# Patient Record
Sex: Female | Born: 1985 | Race: White | Hispanic: No | Marital: Single | Smoking: Never smoker
Health system: Southern US, Community
[De-identification: ages and names within clinical notes are randomized; demographics above are authoritative.]

## PROBLEM LIST (undated history)

## (undated) DIAGNOSIS — E119 Type 2 diabetes mellitus without complications: Secondary | ICD-10-CM

## (undated) HISTORY — PX: WRIST SURGERY: SHX841

---

## 2016-04-19 ENCOUNTER — Encounter: Payer: Self-pay | Admitting: Emergency Medicine

## 2016-04-19 ENCOUNTER — Emergency Department (INDEPENDENT_AMBULATORY_CARE_PROVIDER_SITE_OTHER)
Admission: EM | Admit: 2016-04-19 | Discharge: 2016-04-19 | Disposition: A | Discharge status: Home / Self Care | Payer: Self-pay | Source: Home / Self Care | Attending: Family Medicine | Admitting: Family Medicine

## 2016-04-19 DIAGNOSIS — B9789 Other viral agents as the cause of diseases classified elsewhere: Secondary | ICD-10-CM

## 2016-04-19 DIAGNOSIS — J069 Acute upper respiratory infection, unspecified: Secondary | ICD-10-CM

## 2016-04-19 MED ORDER — AZITHROMYCIN 250 MG PO TABS: ORAL_TABLET | ORAL | 0 refills | Status: DC

## 2016-04-19 MED ORDER — BENZONATATE 200 MG PO CAPS: ORAL_CAPSULE | ORAL | 0 refills | Status: DC

## 2016-04-19 NOTE — ED Provider Notes (Signed)
[facility]    CSN: [id number] Arrival date & time: 04/19/16  1323     History   Chief Complaint Chief Complaint  Patient presents with  . URI    HPI Lacey Todd is a 31 y.o. female.   About one week ago patient developed typical cold-like symptoms developing over several days, including mild sore throat, sinus congestion, headache, and fatigue.  She was initially treated with prednisone, and improved somewhat, but has now developed a cough and increased sinus congestion.  She has felt hot, but denies fever.  Yesterday her ears felt clogged.  She has a past history of otitis media when she was younger.  Four days ago she had nausea/vomiting and diarrhea that lasted only one day.  She often coughs until she gags.   The history is provided by the patient.    History reviewed. No pertinent past medical history.  There are no active problems to display for this patient.   History reviewed. No pertinent surgical history.  OB History    No data available       Home Medications    Prior to Admission medications   Medication Sig Start Date End Date Taking? Authorizing Provider  guaiFENesin (MUCINEX) 600 MG 12 hr tablet Take by mouth 2 (two) times daily.   Yes Historical Provider, MD  azithromycin (ZITHROMAX Z-PAK) 250 MG tablet Take 2 tabs today; then begin one tab once daily for 4 more days. 04/19/16   Lattie Haw, MD  benzonatate (TESSALON) 200 MG capsule Take one cap by mouth at bedtime as needed for cough.  May repeat in 4 to 6 hours 04/19/16   Lattie Haw, MD    Family History No family history on file.  Social History Social History  Substance Use Topics  . Smoking status: Never Smoker  . Smokeless tobacco: Never Used  . Alcohol use No     Allergies   Amoxicillin; Penicillins; and Septra [sulfamethoxazole-trimethoprim]   Review of Systems Review of Systems + sore throat + cough No pleuritic pain No wheezing + nasal congestion +  post-nasal drainage No sinus pain/pressure No itchy/red eyes ? earache No hemoptysis No SOB No fever/chills + nausea, resolved + vomiting, resolved No abdominal pain +diarrhea, resolved  No urinary symptoms No skin rash + fatigue No myalgias + headache Used OTC meds without relief   Physical Exam Triage Vital Signs ED Triage Vitals  Enc Vitals Group     BP 04/19/16 1414 125/83     Pulse Rate 04/19/16 1414 75     Resp --      Temp 04/19/16 1414 98.3 F (36.8 C)     Temp Source 04/19/16 1414 Oral     SpO2 04/19/16 1414 98 %     Weight 04/19/16 1415 273 lb (123.8 kg)     Height 04/19/16 1415 5\' 10"  (1.778 m)     Head Circumference --      Peak Flow --      Pain Score 04/19/16 1418 3     Pain Loc --      Pain Edu? --      Excl. in GC? --    No data found.   Updated Vital Signs BP 125/83 (BP Location: Left Arm)   Pulse 75   Temp 98.3 F (36.8 C) (Oral)   Ht 5\' 10"  (1.778 m)   Wt 273 lb (123.8 kg)   LMP 03/29/2016 (Exact Date)   SpO2 98%  BMI 39.17 kg/m   Visual Acuity Right Eye Distance:   Left Eye Distance:   Bilateral Distance:    Right Eye Near:   Left Eye Near:    Bilateral Near:     Physical Exam Nursing notes and Vital Signs reviewed. Appearance:  Patient appears stated age, and in no acute distress Eyes:  Pupils are equal, round, and reactive to light and accomodation.  Extraocular movement is intact.  Conjunctivae are not inflamed  Ears:  Canals normal.  Tympanic membranes normal.  Nose:  Mildly congested turbinates.  No sinus tenderness.   Pharynx:  Normal Neck:  Supple.  Tender enlarged posterior/lateral nodes are palpated bilaterally  Lungs:  Clear to auscultation.  Breath sounds are equal.  Moving air well. Heart:  Regular rate and rhythm without murmurs, rubs, or gallops.  Abdomen:  Nontender without masses or hepatosplenomegaly.  Bowel sounds are present.  No CVA or flank tenderness.  Extremities:  No edema.  Skin:  No rash present.     UC Treatments / Results  Labs (all labs ordered are listed, but only abnormal results are displayed) Labs Reviewed - No data to display  EKG  EKG Interpretation None       Radiology No results found.  Procedures Procedures (including critical care time)  Medications Ordered in UC Medications - No data to display   Initial Impression / Assessment and Plan / UC Course  I have reviewed the triage vital signs and the nursing notes.  Pertinent labs & imaging results that were available during my care of the patient were reviewed by me and considered in my medical decision making (see chart for details).    Begin Z-pak for atypical coverage.  Prescription written for Benzonatate [facility](Tessalon) to take at bedtime for night-time cough.                                                    Take plain guaifenesin (1200mg  extended release tabs such as Mucinex) twice daily, with plenty of water, for cough and congestion.  May add Pseudoephedrine (30mg , one or two every 4 to 6 hours) for sinus congestion.  Get adequate rest.   May use Afrin nasal spray (or generic oxymetazoline) twice daily for about 5 days and then discontinue.  Also recommend using saline nasal spray several times daily and saline nasal irrigation (AYR is a common brand).  Use Flonase nasal spray each morning after using Afrin nasal spray and saline nasal irrigation. Try warm salt water gargles for sore throat.  Stop all antihistamines for now, and other non-prescription cough/cold preparations. May take Ibuprofen 200mg , 4 tabs every 8 hours with food for headache, sore throat, etc. Followup with Family Doctor if not improved in one week.     Final Clinical Impressions(s) / UC Diagnoses   Final diagnoses:  Viral URI with cough    New Prescriptions New Prescriptions   AZITHROMYCIN (ZITHROMAX Z-PAK) 250 MG TABLET    Take 2 tabs today; then begin one tab once daily for 4 more days.   BENZONATATE (TESSALON) 200 MG CAPSULE     Take one cap by mouth at bedtime as needed for cough.  May repeat in 4 to 6 hours     Lattie HawStephen A Aeva Posey, MD 04/19/16 1524

## 2016-04-19 NOTE — ED Triage Notes (Signed)
Sinus pain, pressure, congestion, ears hurt, sore throat, cough, fatigue, headache x 1 week. Was treated last week with Prednisone Tues-Sat, didn't get better

## 2016-04-19 NOTE — Discharge Instructions (Signed)
Take plain guaifenesin (1200mg  extended release tabs such as Mucinex) twice daily, with plenty of water, for cough and congestion.  May add Pseudoephedrine (30mg , one or two every 4 to 6 hours) for sinus congestion.  Get adequate rest.   May use Afrin nasal spray (or generic oxymetazoline) twice daily for about 5 days and then discontinue.  Also recommend using saline nasal spray several times daily and saline nasal irrigation (AYR is a common brand).  Use Flonase nasal spray each morning after using Afrin nasal spray and saline nasal irrigation. Try warm salt water gargles for sore throat.  Stop all antihistamines for now, and other non-prescription cough/cold preparations. May take Ibuprofen 200mg , 4 tabs every 8 hours with food for headache, sore throat, etc.

## 2016-05-18 ENCOUNTER — Emergency Department
Admission: EM | Admit: 2016-05-18 | Discharge: 2016-05-18 | Disposition: A | Discharge status: Home / Self Care | Payer: Self-pay | Source: Home / Self Care | Attending: Family Medicine | Admitting: Family Medicine

## 2016-05-18 ENCOUNTER — Emergency Department (INDEPENDENT_AMBULATORY_CARE_PROVIDER_SITE_OTHER): Payer: Self-pay

## 2016-05-18 ENCOUNTER — Encounter: Payer: Self-pay | Admitting: *Deleted

## 2016-05-18 DIAGNOSIS — M25532 Pain in left wrist: Secondary | ICD-10-CM

## 2016-05-18 DIAGNOSIS — M79642 Pain in left hand: Secondary | ICD-10-CM

## 2016-05-18 DIAGNOSIS — S63502A Unspecified sprain of left wrist, initial encounter: Secondary | ICD-10-CM

## 2016-05-18 MED ORDER — ACETAMINOPHEN 325 MG PO TABS
650.0000 mg | ORAL_TABLET | Freq: Once | ORAL | Status: AC
  Administered 2016-05-18: 650 mg via ORAL

## 2016-05-18 NOTE — ED Triage Notes (Signed)
Pt reports a possible LT wrist sprain in November while kick boxing and re injured again last week while moving boxes. Today she reports sudden pain and heard a pop while in the shower. IBF at 1030 today.

## 2016-05-18 NOTE — ED Provider Notes (Signed)
CSN: [id number]     Arrival date & time 05/18/16  1609 History   First MD Initiated Contact with Patient 05/18/16 1630     Chief Complaint  Patient presents with  . Wrist Pain   (Consider location/radiation/quality/duration/timing/severity/associated sxs/prior Treatment) HPI Lacey Todd is a 31 y.o. female presenting to UC with c/o Left wrist pain that has been intermittent since injuring it in Nov 2017 while kick boxing. Pt notes she initially injured it by punching a new piece of equipment that did not have give like the prior bags she had practiced on.  Pain was aching and sore so she treated herself with an OTC wrist splint for self-dx sprain.  Pain gradually improved. This weekend she was lifting boxes for work and thinks she re-injured it as it was a little more sore.  This morning in the shower she turned her wrist and felt sudden onset sharp shooting pain and heard a "pop"  She took ibuprofen at 10:30AM today with mild relief. She is Right hand dominant.    History reviewed. No pertinent past medical history. History reviewed. No pertinent surgical history. History reviewed. No pertinent family history. Social History  Substance Use Topics  . Smoking status: Never Smoker  . Smokeless tobacco: Never Used  . Alcohol use No   OB History    No data available     Review of Systems  Musculoskeletal: Positive for arthralgias and myalgias. Negative for joint swelling.  Skin: Negative for color change and wound.  Neurological: Positive for weakness. Negative for numbness.    Allergies  Amoxicillin; Penicillins; and Septra [sulfamethoxazole-trimethoprim]  Home Medications   Prior to Admission medications   Not on File   Meds Ordered and Administered this Visit   Medications  acetaminophen (TYLENOL) tablet 650 mg (650 mg Oral Given 05/18/16 1635)    BP 119/84 (BP Location: Left Arm)   Pulse 73   Resp 16   LMP 04/26/2016   SpO2 99%  No data found.   Physical Exam   Constitutional: She is oriented to person, place, and time. She appears well-developed and well-nourished. No distress.  HENT:  Head: Normocephalic and atraumatic.  Eyes: EOM are normal.  Neck: Normal range of motion.  Cardiovascular: Normal rate.   Pulses:      Radial pulses are 2+ on the left side.  Pulmonary/Chest: Effort normal.  Musculoskeletal: She exhibits tenderness. She exhibits no edema.  Left wrist: no obvious edema. Tenderness to ulnar aspect. Slight decreased ROM with flexion and extension. 4/5 grip strength compared to the Right. Left elbow: full ROM, no tenderness.  Neurological: She is alert and oriented to person, place, and time.  Skin: Skin is warm and dry. Capillary refill takes less than 2 seconds. She is not diaphoretic. No erythema.  Left hand and wrist: skin in tact. No ecchymosis or erythema.   Psychiatric: She has a normal mood and affect. Her behavior is normal.  Nursing note and vitals reviewed.   Urgent Care Course     Procedures (including critical care time)  Labs Review Labs Reviewed - No data to display  Imaging Review Dg Wrist Complete Left  Result Date: 05/18/2016 CLINICAL DATA:  31 y/o  F; pain and swelling from injury 11/17. EXAM: LEFT HAND - COMPLETE 3+ VIEW; LEFT WRIST - COMPLETE 3+ VIEW COMPARISON:  None. FINDINGS: Left hand: There is no evidence of fracture or dislocation. There is no evidence of arthropathy or other focal bone abnormality. Soft tissues are unremarkable.  Left wrist: There is no evidence of fracture or dislocation. There is no evidence of arthropathy or other focal bone abnormality. Soft tissues are unremarkable. IMPRESSION: Negative. Electronically Signed   By: Mitzi Hansen M.D.   On: 05/18/2016 17:23   Dg Hand Complete Left  Result Date: 05/18/2016 CLINICAL DATA:  31 y/o  F; pain and swelling from injury 11/17. EXAM: LEFT HAND - COMPLETE 3+ VIEW; LEFT WRIST - COMPLETE 3+ VIEW COMPARISON:  None. FINDINGS: Left  hand: There is no evidence of fracture or dislocation. There is no evidence of arthropathy or other focal bone abnormality. Soft tissues are unremarkable. Left wrist: There is no evidence of fracture or dislocation. There is no evidence of arthropathy or other focal bone abnormality. Soft tissues are unremarkable. IMPRESSION: Negative. Electronically Signed   By: Mitzi Hansen M.D.   On: 05/18/2016 17:23     MDM   1. Sprain of left wrist, initial encounter    Skin in tact. No evidence of underlying infection.  No fracture or dislocation noted on imaging  Will continue to treat as sprain Home exercises provided Encouraged f/u with Sports Medicine in 1-2 weeks if not improving.     Junius Finner, PA-C 05/18/16 [phone]

## 2016-05-19 ENCOUNTER — Ambulatory Visit (INDEPENDENT_AMBULATORY_CARE_PROVIDER_SITE_OTHER): Payer: Self-pay | Admitting: Family Medicine

## 2016-05-19 DIAGNOSIS — M25532 Pain in left wrist: Secondary | ICD-10-CM

## 2016-05-19 MED ORDER — DICLOFENAC SODIUM 1 % TD GEL: 2.0000 g | Freq: Four times a day (QID) | TRANSDERMAL | 11 refills | Status: DC

## 2016-05-19 NOTE — Patient Instructions (Addendum)
Thank you for coming in today. You should hear about the MRI soon.  Schedule with me 1 hour before the MRI.  Follow up 2 days later or so.  Use voltaren gel for pain as well.   Use the brace as needed.    Triangular Fibrocartilage Tear A triangular fibrocartilage tear is a tear in cartilage or a ligament along the pinkie side of your wrist. The cartilage and ligaments in your wrist help to cushion and support to the bones of your wrist. What are the causes? This condition may be caused by:  Falling onto an outstretched hand and overextending your wrist.  Repetitive motions (overuse) that put too much pressure on your wrist. What increases the risk? This condition is more likely to develop in people who:  Have one forearm bone that is shorter than the other.  Participate in sports that put pressure on the wrist, such as:  Gymnastics.  Tennis.  Golf.  Baseball.  Racquetball.  Hockey. What are the signs or symptoms? Symptoms of this condition include:  Pain or tenderness on the pinkie side of your wrist.  A clicking or popping sensation in the wrist.  Reduced grip strength. How is this diagnosed? This condition may be diagnosed based on:  Your symptoms.  Your medical history.  A physical exam. During the exam your health care provider may move your hand and wrist to determine what is causing your pain.  Tests, such as:  An X-ray. This may be done to check for broken bones.  An MRI. This may be done to check ligaments and cartilage and to look for broken bones that did not show up on your X-ray.  An arthrogram. This is a kind of X-ray called that is taken after a dye is injected into your joint.  Diagnostic arthroscopy. This is a surgical procedure that lets your health care provider see inside your wrist joint. It may be done if the cause of your wrist pain is not clear after you have other tests. How is this treated? Treatment for this condition may  include:  Resting the wrist. You may need to avoid or modify your participation in sports or other physical activity for a period of time.  Icing the wrist. This can help with swelling and pain.  Keeping the wrist raised (elevated) above your heart. This helps reduce swelling.  Using a splint or cast. This helps keep the wrist still so it can heal.  Physical therapy. This helps restore range of motion in the wrist and strengthen the wrist.  Anti-inflammatory medicine, such as ibuprofen. These can help reduce pain and swelling.  Surgery. More serious tears or complete tears may require surgery to repair a ligament. Follow these instructions at home: If you have a splint:    Do not put pressure on any part of your splint until it is fully hardened. This may take several hours.  Wear it as told by your health care provider. Remove it only as told by your health care provider.  Loosen the splint if your fingers become numb and tingle, or if they turn cold and blue.  If your splint is not waterproof:  Do not let it get wet.  Cover it with a watertight covering when you take a bath or a shower.  Keep the splint clean. If you have a cast:   Do not put pressure on any part of your cast until it is fully hardened. This may take several hours.  Do  not stick anything inside the cast to scratch your skin. Doing that increases your risk of infection.  Check the skin around the cast every day. Report any concerns to your health care provider.  You may put lotion on dry skin around the edges of the cast. Do not apply lotion to the skin underneath the cast.  If your cast is not waterproof:  Do not let it get wet.  Cover it with a watertight covering when you take a bath or a shower.  Keep the cast clean. Managing pain, stiffness, and swelling   Take over-the-counter and prescription medicines only as told by your health care provider.  If directed, put ice on the injured  area.  Put ice in a plastic bag.  Place a towel between your skin and the bag.  Leave the ice on for 20 minutes, 2-3 times a day or as needed.  Move your fingers often to avoid stiffness and to lessen swelling.  Elevate the injured area above the level of your heart while you are sitting or lying down. Activity   Return to your normal activities as told by your health care provider. Ask your health care provider what activities are safe for you.  Do exercises only as told by your health care provider. General instructions   Do not use your wrist to support your body weight until your health care provider says that you can.  Ask your health care provider when it is safe for you to drive if you have a splint or a cast.  Keep all follow-up visits as told by your health care provider. This is important. How is this prevented?  Warm up and stretch before being active.  Cool down and stretch after being active.  Give your body time to rest between periods of activity.  Make sure to use equipment that fits you.  Be safe and responsible while being active to avoid falls.  Do at least 150 minutes of moderate-intensity exercise each week, such as brisk walking or water aerobics.  Maintain physical fitness, including:  Strength.  Flexibility.  Cardiovascular fitness.  Endurance. Contact a health care provider if:  Your wrist pain does not improve or it gets worse. Get help right away if:  You have severe pain.  You cannot move your wrist. This information is not intended to replace advice given to you by your health care provider. Make sure you discuss any questions you have with your health care provider. Document Released: 02/22/2005 Document Revised: 10/29/2015 Document Reviewed: 12/31/2014 Elsevier Interactive Patient Education  2017 [employer].   Flexor Carpi Ulnaris and [employer]urant Radialis Tendinitis What are the causes? These conditions may be caused  by:  Repetitive motions or overuse (common).  Wear and tear. (common).  An injury.  Excessive exercise or strain.  Certain antibiotic medicines. In some cases, the cause may not be known. What increases the risk? These conditions are more likely to develop in:  People who play sports that involve constantly flexing or stretching the wrist and forearm, such as volleyball and water polo.  Older adults.  People with have a job that involves flexing the wrist over and over, such as people who work as Journalist, newspaper, Tax inspector, Facilities manager.  People with certain health conditions, such as:  Rheumatoid arthritis.  Gout.  Diabetes. What are the signs or symptoms? Symptoms of these conditions may develop gradually. Symptoms include:  Pain or tenderness in the wrist.  Pain when flexing or stretching the  wrist.  Pain when gripping or lifting with the palm of the hand.  Swelling. How is this diagnosed? This condition may be diagnosed based on:  Your symptoms.  Your medical history.  A physical exam. During the physical exam, you may be asked to move your hand, wrist, and arm in certain ways. In order to rule out another condition, your health care provider may order one or more of the following tests:  MRI to get detailed images of the body's soft tissues and detect tendon tears and inflammation.  Ultrasound to detect soft-tissue injuries, such as tears and inflammation of the ligaments or tendons. How is this treated? Treatment for this condition may include:  Rest. You should limit activities that cause your symptoms to get worse or flare up.  Heat and ice treatment. Both heat and cold can help to ease pain and may be applied to the wrist or forearm as needed to reduce pain and inflammation.  Splint. You may need to wear a splint to keep your wrist and forearm from moving (keep them immobilized) until your symptoms improve.  Medicine. Your health care provider may prescribe  steroids or other anti-inflammatory medicines, like ibuprofen, to temporarily ease your pain and other symptoms.  Physical therapy. Your health care provider may ask you to do exercises to maintain mobility and range of motion in your wrist. Follow these instructions at home: If you have a splint:   Wear it as told by your health care provider. Remove it only as told by your health care provider.  Loosen the splint if your fingers tingle, become numb, or turn cold and blue.  Do not let your splint get wet if it is not waterproof.  Keep the splint clean. Managing pain, stiffness, and swelling   If directed, apply ice to the injured area.  Put ice in a plastic bag.  Place a damp towel between your skin and the bag.  Leave the ice on for 20 minutes, 2-3 times a day.  Move your fingers often to avoid stiffness and to lessen swelling.  Raise (elevate) the injured area above the level of your heart while you are sitting or lying down. Activity   Return to your normal activities as told by your health care provider. Ask your health care provider what activities are safe for you.  Do exercises as told by your health care provider. General instructions   Do not use any tobacco products, including cigarettes, chewing tobacco, or e-cigarettes. Tobacco can delay healing. If you need help quitting, ask your health care provider.  Take over-the-counter and prescription medicines only as told by your health care provider.  Keep all follow-up visits as told by your health care provider. This is important. How is this prevented?  Warm up and stretch before being active.  Cool down and stretch after being active.  Give your body time to rest between periods of activity.  Make sure to use equipment that fits you.  Be safe and responsible while being active to avoid falls.  Do at least 150 minutes of moderate-intensity exercise each week, such as brisk walking or water  aerobics.  Maintain physical fitness, including:  Strength.  Flexibility.  Cardiovascular fitness.  Endurance. Contact a health care provider if:  Your pain does not improve.  Your pain gets worse. Get help right away if:  Your pain is severe.  You cannot move your wrist. This information is not intended to replace advice given to you by your  health care provider. Make sure you discuss any questions you have with your health care provider. Document Released: 02/22/2005 Document Revised: 10/28/2015 Document Reviewed: 11/01/2014 Elsevier Interactive Patient Education  2017 [employer]

## 2016-05-20 ENCOUNTER — Telehealth: Payer: Self-pay | Admitting: Family Medicine

## 2016-05-20 NOTE — Progress Notes (Signed)
   Subjective:    I'm seeing this patient as a consultation for:  Junius FinnerErin O'Malley PA-C  CC: Left wrist pain  HPI: Patient notes a several month history of left wrist pain occurring during kickboxing practice. She felt her risk it jammed when she was punching a bag. She's had pain ongoing since. The pain is worsening recently. 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 She was seen in urgent care on March 13 for she was thought to have a wrist strain after a relatively normal x-ray of her wrist was obtained. She's been treated with a splint and ibuprofen which helps a little. She denies any radiating pain weakness or numbness.  Past medical history, Surgical history, Family history not pertinant except as noted below, Social history, Allergies, and medications have been entered into the medical record, reviewed, and no changes needed.   Review of Systems: No headache, visual changes, nausea, vomiting, diarrhea, constipation, dizziness, abdominal pain, skin rash, fevers, chills, night sweats, weight loss, swollen lymph nodes, body aches, joint swelling, muscle aches, chest pain, shortness of breath, mood changes, visual or auditory hallucinations.   Objective:    Vitals:   05/19/16 1616  BP: 124/79  Pulse: 74   General: Well Developed, well nourished, and in no acute distress.  Neuro/Psych: Alert and oriented x3, extra-ocular muscles intact, able to move all 4 extremities, sensation grossly intact. Skin: Warm and dry, no rashes noted.  Respiratory: Not using accessory muscles, speaking in full sentences, trachea midline.  Cardiovascular: Pulses palpable, no extremity edema. Abdomen: Does not appear distended. MSK: Left wrist is unremarkable. With no obvious swelling. Tender to palpation ulnar wrist at the TFCC area and dorsal and volar areas on the ulnar wrist.  Normal extension and flexion. Diminished ulnar deviation. Pulses capillary refill sensation  intact distally. Palpable clicking with motion present.  No results found for this or any previous visit (from the past 24 hour(s)). Dg Wrist Complete Left  Result Date: 05/18/2016 CLINICAL DATA:  31 y/o  F; pain and swelling from injury 11/17. EXAM: LEFT HAND - COMPLETE 3+ VIEW; LEFT WRIST - COMPLETE 3+ VIEW COMPARISON:  None. FINDINGS: Left hand: There is no evidence of fracture or dislocation. There is no evidence of arthropathy or other focal bone abnormality. Soft tissues are unremarkable. Left wrist: There is no evidence of fracture or dislocation. There is no evidence of arthropathy or other focal bone abnormality. Soft tissues are unremarkable. IMPRESSION: Negative. Electronically Signed   By: Mitzi HansenLance  Furusawa-Stratton M.D.   On: 05/18/2016 17:23   Dg Hand Complete Left  Result Date: 05/18/2016 CLINICAL DATA:  31 y/o  F; pain and swelling from injury 11/17. EXAM: LEFT HAND - COMPLETE 3+ VIEW; LEFT WRIST - COMPLETE 3+ VIEW COMPARISON:  None. FINDINGS: Left hand: There is no evidence of fracture or dislocation. There is no evidence of arthropathy or other focal bone abnormality. Soft tissues are unremarkable. Left wrist: There is no evidence of fracture or dislocation. There is no evidence of arthropathy or other focal bone abnormality. Soft tissues are unremarkable. IMPRESSION: Negative. Electronically Signed   By: Mitzi HansenLance  Furusawa-Stratton M.D.   On: 05/18/2016 17:23    Impression and Recommendations:    Assessment and Plan: 31 y.o. female with Left wrist pain and concern for TFCC injury. Plan to work this up with MRI arthrogram.. Return after arthrogram.   Discussed warning signs or symptoms. Please see discharge instructions. Patient expresses understanding.

## 2016-05-20 NOTE — Telephone Encounter (Signed)
Spoke with Pt, her insurance is Medicaid Family planning only so it will not cover the cost of an MRI. Pt is going to try and get approval for cone assistance. In the mean time, Pt questions if Provider still thinks MRI is needed. Questions if it could be treated in a conservative manner rather than get the MRI due to cost. Will route.

## 2016-05-21 NOTE — Telephone Encounter (Signed)
Pt advised. Verbalized understanding.

## 2016-05-21 NOTE — Telephone Encounter (Signed)
Pt called advising that she would like to proceed with having the MRI at this time. Placed form for financial assistance up front for pt to pick up. Pt notified. Advised pt per Riley ChurchesKelsi that she could contact the imaging dept to schedule. Contact information provided.

## 2016-05-21 NOTE — Telephone Encounter (Signed)
Lets try the voltaren gel I prescribed with the brace as needed.  If not better in 1 month we can look more into it.

## 2017-04-05 ENCOUNTER — Encounter: Payer: Self-pay | Admitting: Sports Medicine

## 2017-04-05 ENCOUNTER — Ambulatory Visit (INDEPENDENT_AMBULATORY_CARE_PROVIDER_SITE_OTHER): Payer: BLUE CROSS/BLUE SHIELD | Admitting: Sports Medicine

## 2017-04-05 DIAGNOSIS — M25532 Pain in left wrist: Secondary | ICD-10-CM

## 2017-04-05 MED ORDER — IBUPROFEN 800 MG PO TABS: 800.0000 mg | ORAL_TABLET | Freq: Three times a day (TID) | ORAL | 2 refills | Status: DC | PRN

## 2017-04-05 NOTE — Assessment & Plan Note (Signed)
Chronic left wrist pain now for almost a year. 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 She does have a bit of pain with ulnar deviation passively that may represent a TFCC tear as well. She has worn a brace for 8 months now intermittently. Ibuprofen works better than diclofenac, calling in a prescription for this. I am going also set her up for an MR arthrogram. I do anticipate cast immobilization for a month after the MRI.   Return to see me for arthrogram injection.

## 2017-04-05 NOTE — Progress Notes (Signed)
Subjective:    I'm seeing this patient as a consultation for: Lacey HalterEmma Lawrence, PA-C  CC: Left wrist pain  HPI: This is a pleasant 32 year old female, for almost a year now after doing a boxing course in the gym and taking a left hook which injured her wrist, she has had pain that she localizes along the ulnar aspect of her left wrist, moderate, persistent.  She has had 8 months of intermittent splint immobilization, x-rays that were negative, but has never had an MRI, never been casted and not had physical therapy or injections.  Symptoms are moderate, persistent, ulnar-sided, no real mechanical symptoms.  I reviewed the past medical history, family history, social history, surgical history, and allergies today and no changes were needed.  Please see the problem list section below in epic for further details.  Past Medical History: No past medical history on file. Past Surgical History: No past surgical history on file. Social History: Social History   Socioeconomic History  . Marital status: Single    Spouse name: None  . Number of children: None  . Years of education: None  . Highest education level: None  Social Needs  . Financial resource strain: None  . Food insecurity - worry: None  . Food insecurity - inability: None  . Transportation needs - medical: None  . Transportation needs - non-medical: None  Occupational History  . None  Tobacco Use  . Smoking status: Never Smoker  . Smokeless tobacco: Never Used  Substance and Sexual Activity  . Alcohol use: No  . Drug use: No  . Sexual activity: None  Other Topics Concern  . None  Social History Narrative  . None   Family History: No family history on file. Allergies: Allergies  Allergen Reactions  . Amoxicillin   . Penicillins   . Septra [Sulfamethoxazole-Trimethoprim]    Medications: See med rec.  Review of Systems: No headache, visual changes, nausea, vomiting, diarrhea, constipation, dizziness, abdominal  pain, skin rash, fevers, chills, night sweats, weight loss, swollen lymph nodes, body aches, joint swelling, muscle aches, chest pain, shortness of breath, mood changes, visual or auditory hallucinations.   Objective:   General: Well Developed, well nourished, and in no acute distress.  Neuro:  Extra-ocular muscles intact, able to move all 4 extremities, sensation grossly intact.  Deep tendon reflexes tested were normal. Psych: Alert and oriented, mood congruent with affect. ENT:  Ears and nose appear unremarkable.  Hearing grossly normal. Neck: Unremarkable overall appearance, trachea midline.  No visible thyroid enlargement. Eyes: Conjunctivae and lids appear unremarkable.  Pupils equal and round. Skin: Warm and dry, no rashes noted.  Cardiovascular: Pulses palpable, no extremity edema. Left wrist: Ulnar-sided swelling ROM smooth and normal with good flexion and extension and ulnar/radial deviation that is symmetrical with opposite wrist. Palpation is normal over metacarpals, navicular, lunate. Ulnar sided tenderness to palpation, pain directly over the sixth extensor compartment and extensor carpi ulnaris tendon.  Pain with passive radial deviation of the wrist, pain with active ulnar deviation.  Passive ulnar deviation only produces minimal pain. No snuffbox tenderness. No tenderness over Canal of Gu[place]. Strength 5/5 in all directions without pain. Negative tinel's and phalens signs. Negative Finkelstein sign. Negative Watson's test.  Impression and Recommendations:   This case required medical decision making of moderate complexity.  Left wrist pain Chronic left wrist pain now for almost a year. 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 She does have a bit of pain with ulnar deviation  passively that may represent a TFCC tear as well. She has worn a brace for 8 months now intermittently. Ibuprofen works better than diclofenac, calling in a  prescription for this. I am going also set her up for an MR arthrogram. I do anticipate cast immobilization for a month after the MRI.   Return to see me for arthrogram injection.  ___________________________________________ Ihor Austin. Benjamin Stain, M.D., ABFM., CAQSM. Primary Care and Sports Medicine [facility]  Adjunct Instructor of Family Medicine  [employer]

## 2017-04-11 ENCOUNTER — Ambulatory Visit (INDEPENDENT_AMBULATORY_CARE_PROVIDER_SITE_OTHER): Payer: BLUE CROSS/BLUE SHIELD

## 2017-04-11 ENCOUNTER — Ambulatory Visit (INDEPENDENT_AMBULATORY_CARE_PROVIDER_SITE_OTHER): Payer: BLUE CROSS/BLUE SHIELD | Admitting: Sports Medicine

## 2017-04-11 ENCOUNTER — Encounter: Payer: Self-pay | Admitting: Sports Medicine

## 2017-04-11 DIAGNOSIS — W2189XD Striking against or struck by other sports equipment, subsequent encounter: Secondary | ICD-10-CM

## 2017-04-11 DIAGNOSIS — S63592D Other specified sprain of left wrist, subsequent encounter: Secondary | ICD-10-CM | POA: Diagnosis not present

## 2017-04-11 DIAGNOSIS — M25532 Pain in left wrist: Secondary | ICD-10-CM

## 2017-04-11 MED ORDER — GADOBENATE DIMEGLUMINE 529 MG/ML IV SOLN
1.0000 mL | Freq: Once | INTRAVENOUS | Status: AC | PRN
  Administered 2017-04-11: 1 mL via INTRAVENOUS

## 2017-04-11 NOTE — Addendum Note (Signed)
Addended by: Monica BectonHEKKEKANDAM, Charlaine Utsey J on: 04/11/2017 02:58 PM   Modules accepted: Orders

## 2017-04-11 NOTE — Assessment & Plan Note (Addendum)
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 She did also have some pain passively with ulnar deviation that may represent a TFCC tear. Brace has been worn for 8 months. Arthrogram injection today, further follow-up will depend on results of the arthrogram.  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  No extension of contrast into the distal radial ulnar joint, she has been in a wrist brace for over 8 months now, I would like Dr. Amanda PeaGramig to give us a surgical opinion at this point.

## 2017-04-11 NOTE — Progress Notes (Addendum)
   Procedure: Real-time Ultrasound Guided gadolinium contrast injection of left radiocarpal joint Device: GE Logiq E  Verbal informed consent obtained.  Time-out conducted.  Noted no overlying erythema, induration, or other signs of local infection.  Skin prepped in a sterile fashion.  Local anesthesia: Topical Ethyl chloride.  With sterile technique and under real time ultrasound guidance: Using a 25-gauge needle advanced into the radiocarpal joint, I then injected 1 cc kenalog 40, 1 cc lidocaine, 1 cc bupivacaine, syringe switched and 0.05 cc gadolinium injected, syringe again switched and I filled the rest of the joint to its capacity with sterile saline Joint visualized and capsule seen distending confirming intra-articular placement of contrast material and medication. Completed without difficulty  Advised to call if fevers/chills, erythema, induration, drainage, or persistent bleeding.  Images permanently stored and available for review in the ultrasound unit.  Impression: Technically successful ultrasound guided gadolinium contrast injection for MR arthrography.  Please see separate MR arthrogram report.

## 2017-04-13 ENCOUNTER — Telehealth: Payer: Self-pay

## 2017-04-13 NOTE — Telephone Encounter (Signed)
Pt left VM stating she is still having swelling and pain in her hand into her fingers since the MRI would like to know if this is normal. Please advise.

## 2017-04-13 NOTE — Telephone Encounter (Signed)
Pt notified.  No further questions or concerns at this time.

## 2017-04-13 NOTE — Telephone Encounter (Signed)
Yes, we just did the injection, elevate the hand, icing, if there is any redness, fevers, chills we do need to see it to evaluate for infection.

## 2017-08-10 ENCOUNTER — Other Ambulatory Visit: Payer: Self-pay | Admitting: Sports Medicine

## 2017-08-10 DIAGNOSIS — M25532 Pain in left wrist: Secondary | ICD-10-CM

## 2019-05-21 ENCOUNTER — Ambulatory Visit: Payer: BC Managed Care – PPO | Attending: Internal Medicine

## 2019-05-21 DIAGNOSIS — Z23 Encounter for immunization: Secondary | ICD-10-CM

## 2019-05-21 NOTE — Progress Notes (Signed)
   Covid-19 Vaccination Clinic  Name:  Lacey Todd    MRN: [id number] DOB: 11/02/1985  05/21/2019  Lacey Todd was observed post Covid-19 immunization for 15 minutes without incident. She was provided with Vaccine Information Sheet and instruction to access the V-Safe system.   Lacey Todd was instructed to call 911 with any severe reactions post vaccine: Marland Kitchen Difficulty breathing  . Swelling of face and throat  . A fast heartbeat  . A bad rash all over body  . Dizziness and weakness   Immunizations Administered    Name Date Dose VIS Date Route   Pfizer COVID-19 Vaccine 05/21/2019  4:07 PM 0.3 mL 02/16/2019 Intramuscular   Manufacturer: [employer], [employer]   Lot: [id number]   NDC: [address]-3

## 2019-06-12 ENCOUNTER — Ambulatory Visit: Payer: BC Managed Care – PPO | Attending: Internal Medicine

## 2019-06-12 ENCOUNTER — Ambulatory Visit: Payer: BC Managed Care – PPO

## 2019-06-12 DIAGNOSIS — Z23 Encounter for immunization: Secondary | ICD-10-CM

## 2019-06-12 NOTE — Progress Notes (Signed)
   Covid-19 Vaccination Clinic  Name:  Lacey Todd    MRN: [id number] DOB: [birth year]  06/12/2019  Lacey Todd was observed post Covid-19 immunization for 15 minutes without incident. She was provided with Vaccine Information Sheet and instruction to access the V-Safe system.   Lacey Todd was instructed to call 911 with any severe reactions post vaccine: Marland Kitchen Difficulty breathing  . Swelling of face and throat  . A fast heartbeat  . A bad rash all over body  . Dizziness and weakness   Immunizations Administered    Name Date Dose VIS Date Route   Pfizer COVID-19 Vaccine 06/12/2019  4:36 PM 0.3 mL 02/16/2019 Intramuscular   Manufacturer: [employer], [employer]   Lot: [id number]   NDC: [address]-4

## 2019-09-02 IMAGING — MR MR WRIST*L* W/CM
5 series · 40 of 40 positions shown · IV contrast (agent unspecified)
Comparison: Plain films left wrist and hand 05/18/2016.

CLINICAL DATA: The patient suffered a left hand and wrist injury 1
year ago while boxing. Continued lateral pain and swelling.

EXAM:
MRI OF THE LEFT WRIST WITH CONTRAST (MR Arthrogram)
TECHNIQUE: Multiplanar, multisequence MR imaging of the wrist was performed
immediately following contrast injection into the radiocarpal joint
under fluoroscopic guidance. No intravenous contrast was
administered.

[Series 3: T2 fat-sat · axial · 3.0mm · 0.62mm/px · z∈[-24,+54]mm · 10 of 26 slices shown (1 of 2)]
[im 1/26]
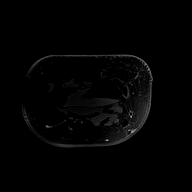
[im 3/26]
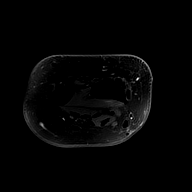
[im 6/26]
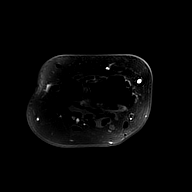
[im 9/26]
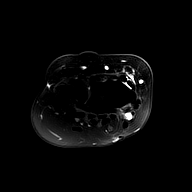
[im 12/26]
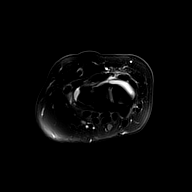
[im 14/26]
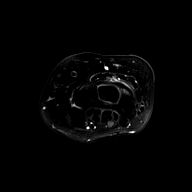
[im 17/26]
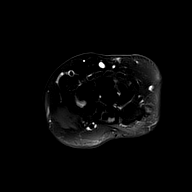
[im 20/26]
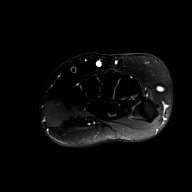
[im 23/26]
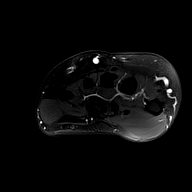
[im 26/26]
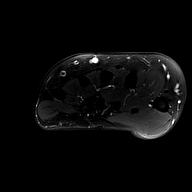

[Series 4: T1 fat-sat · coronal · 3.0mm · 0.34mm/px · 7 of 18 slices shown]
[im 1/18]
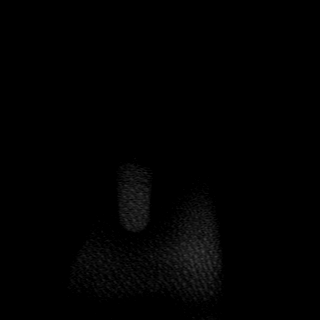
[im 3/18]
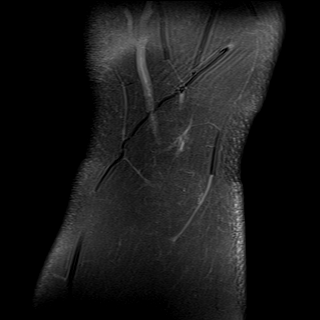
[im 6/18]
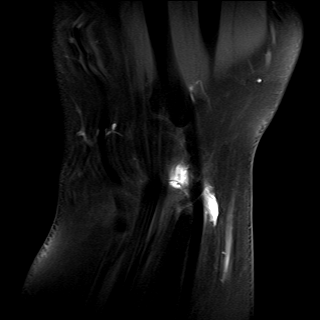
[im 9/18]
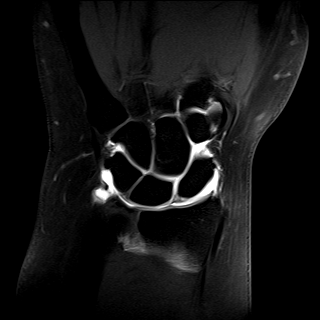
[im 12/18]
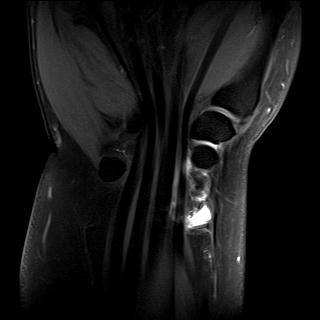
[im 15/18]
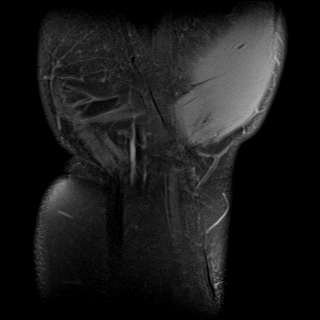
[im 18/18]
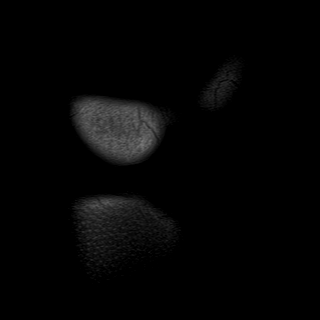

[Series 5: T1 · coronal · 3.0mm · 0.34mm/px · 7 of 19 slices shown]
[im 1/19]
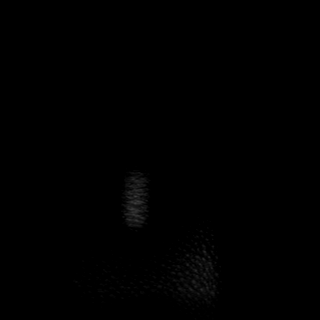
[im 4/19]
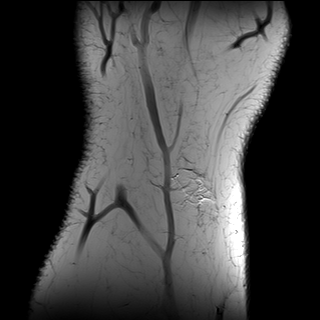
[im 7/19]
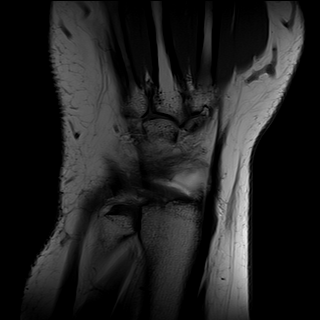
[im 10/19]
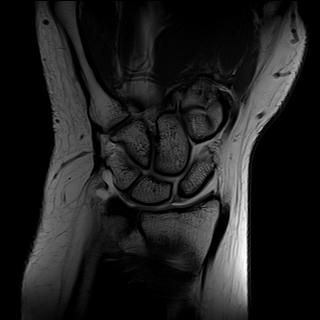
[im 13/19]
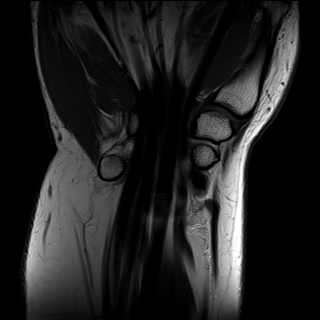
[im 16/19]
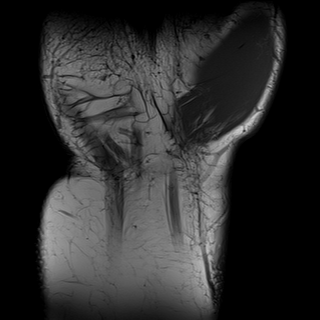
[im 19/19]
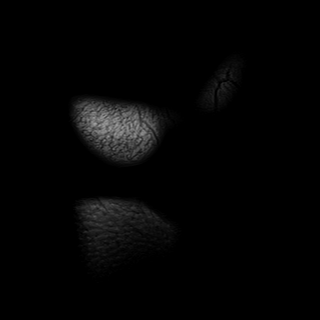

[Series 6: T2 fat-sat · coronal · 3.0mm · 0.43mm/px · 7 of 18 slices shown (2 of 2)]
[im 1/18]
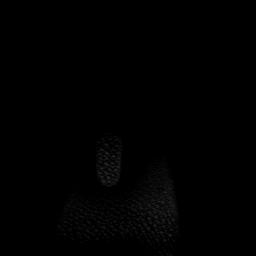
[im 3/18]
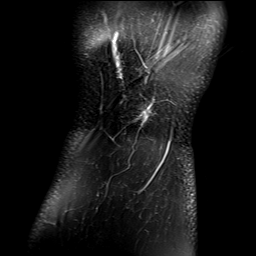
[im 6/18]
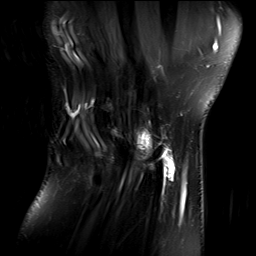
[im 9/18]
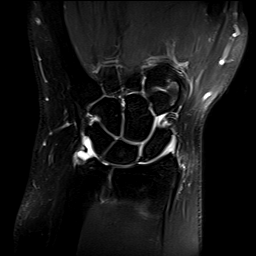
[im 12/18]
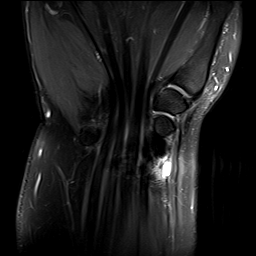
[im 15/18]
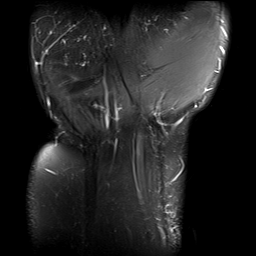
[im 18/18]
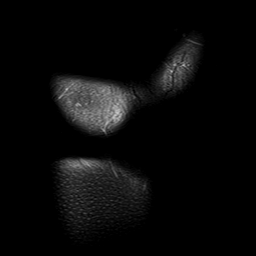

[Series 7: (id) w/fatsat · sagittal · 3.0mm · 0.34mm/px · 9 of 23 slices shown]
[im 1/23]
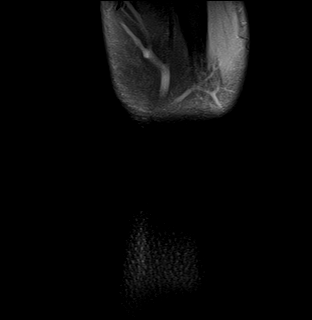
[im 3/23]
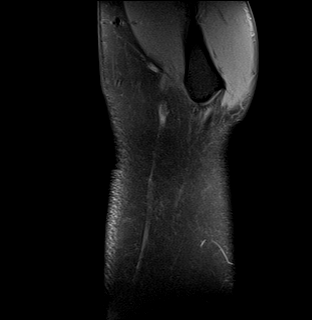
[im 6/23]
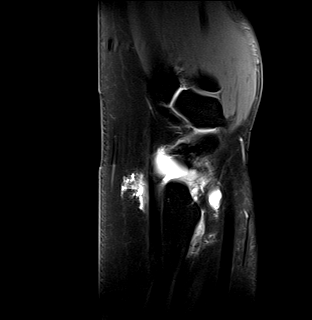
[im 9/23]
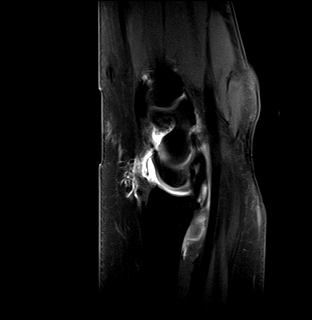
[im 12/23]
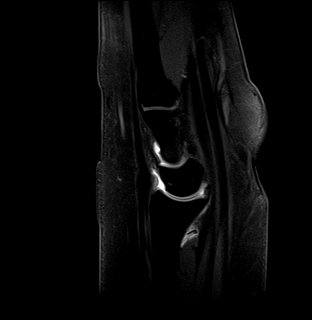
[im 14/23]
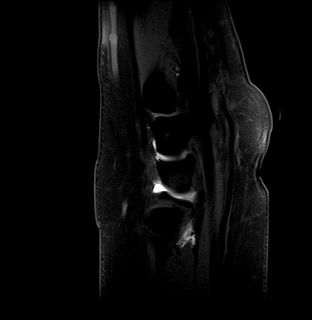
[im 17/23]
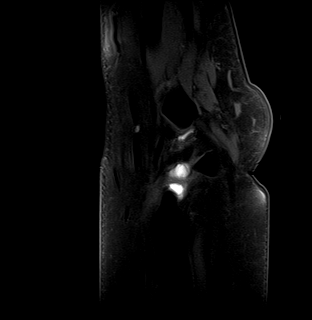
[im 20/23]
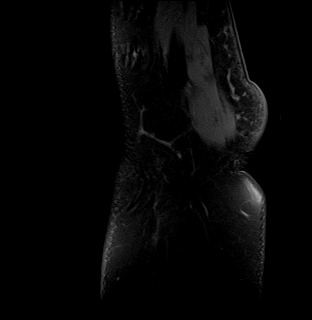
[im 23/23]
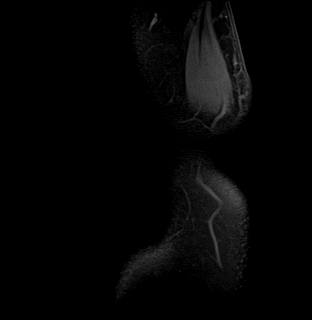

[40 of 40 positions shown; findings below may reference images not displayed]

FINDINGS: Ligaments: The dorsal band of the scapholunate ligament is largely
torn. A thin segment of the most distal fibers of the dorsal band
may be intact. There also tearing of the interstitial portion of the
ligament. Contrast extends through the tear between the scaphoid and
lunate and into the midcarpal compartment. The lunotriquetral
ligament is intact.

Triangular fibrocartilage: Intact.

Tendons: Intact.

Carpal tunnel/median nerve: Normal.

Guyon's canal: Normal.

Joint/cartilage: Normal.

Bones/carpal alignment: No fracture or contusion. Ulnar minus
variance is noted. No DISI or VISI.

Other: None.
IMPRESSION: Near complete tear of the dorsal band of the scapholunate ligament.
There may be a thin band of the distal most fibers of the ligament
which are intact. Interstitial portion of the scapholunate ligament
is also torn. No resultant carpal bone malalignment.

Ulnar minus variance.

## 2020-01-08 ENCOUNTER — Encounter: Payer: Self-pay | Admitting: Emergency Medicine

## 2020-01-08 ENCOUNTER — Emergency Department (INDEPENDENT_AMBULATORY_CARE_PROVIDER_SITE_OTHER)
Admission: EM | Admit: 2020-01-08 | Discharge: 2020-01-08 | Disposition: A | Discharge status: Home / Self Care | Payer: BLUE CROSS/BLUE SHIELD | Source: Home / Self Care | Attending: Family Medicine | Admitting: Family Medicine

## 2020-01-08 ENCOUNTER — Emergency Department: Admit: 2020-01-08 | Discharge status: Absent without leave | Payer: Self-pay

## 2020-01-08 ENCOUNTER — Other Ambulatory Visit: Payer: Self-pay

## 2020-01-08 ENCOUNTER — Emergency Department (INDEPENDENT_AMBULATORY_CARE_PROVIDER_SITE_OTHER): Payer: BLUE CROSS/BLUE SHIELD

## 2020-01-08 DIAGNOSIS — R110 Nausea: Secondary | ICD-10-CM | POA: Diagnosis not present

## 2020-01-08 DIAGNOSIS — R0789 Other chest pain: Secondary | ICD-10-CM | POA: Diagnosis not present

## 2020-01-08 DIAGNOSIS — M940 Chondrocostal junction syndrome [Tietze]: Secondary | ICD-10-CM

## 2020-01-08 HISTORY — DX: Type 2 diabetes mellitus without complications: E11.9

## 2020-01-08 NOTE — ED Triage Notes (Signed)
Upper Rt chest pain started last night

## 2020-01-08 NOTE — ED Provider Notes (Signed)
[facility]    CSN: [id number] Arrival date & time: 01/08/20  1514      History   Chief Complaint Chief Complaint  Patient presents with  . Chest Pain    HPI Lacey Todd is a 34 y.o. female.   Patient complains of onset of non-radiating sharp pain in her right upper chest last night.  The pain waxes and wanes.  She denies cough, shortness of breath, and pleuritic pain.  She denies recent URI, fevers, chills, and sweats, and trauma to her chest. The pain is worse with movement and deep inspiration.  The history is provided by the patient.  Chest Pain Pain location:  R chest Pain quality: sharp   Pain radiates to:  Does not radiate Pain severity:  Mild Onset quality:  Sudden Progression:  Waxing and waning Chronicity:  New Context: breathing, lifting, movement and at rest   Context: not trauma   Relieved by:  Certain positions Worsened by:  Certain positions, deep breathing, coughing and movement Ineffective treatments:  None tried Associated symptoms: no abdominal pain, no AICD problem, no anorexia, no cough, no diaphoresis, no dysphagia, no fatigue, no fever, no heartburn, no nausea, no palpitations, no PND, no shortness of breath and no vomiting   Risk factors: obesity     Past Medical History:  Diagnosis Date  . Diabetes mellitus without complication [facility])     Patient Active Problem List   Diagnosis Date Noted  . Left wrist pain 05/19/2016    Past Surgical History:  Procedure Laterality Date  . WRIST SURGERY      OB History   No obstetric history on file.      Home Medications    Prior to Admission medications   Medication Sig Start Date End Date Taking? Authorizing Provider  metFORMIN (GLUCOPHAGE) 1000 MG tablet Take 1,000 mg by mouth 2 (two) times daily with a meal.   Yes [provider]  ibuprofen (ADVIL,MOTRIN) 800 MG tablet TAKE 1 TABLET BY MOUTH EVERY 8 HOURS AS NEEDED 08/10/17   Monica Becton, MD    Family  History Family History  Problem Relation Age of Onset  . Cancer Mother     Social History Social History   Tobacco Use  . Smoking status: Never Smoker  . Smokeless tobacco: Never Used  Vaping Use  . Vaping Use: Never used  Substance Use Topics  . Alcohol use: No  . Drug use: No     Allergies   Amoxicillin, Penicillins, and Septra [sulfamethoxazole-trimethoprim]   Review of Systems Review of Systems  Constitutional: Negative for activity change, appetite change, chills, diaphoresis, fatigue and fever.  HENT: Negative for trouble swallowing.   Respiratory: Positive for chest tightness. Negative for cough, shortness of breath, wheezing and stridor.   Cardiovascular: Positive for chest pain. Negative for palpitations, leg swelling and PND.  Gastrointestinal: Negative for abdominal pain, anorexia, heartburn, nausea and vomiting.  Skin: Negative for rash.  All other systems reviewed and are negative.    Physical Exam Triage Vital Signs ED Triage Vitals  Enc Vitals Group     BP 01/08/20 1535 (!) 133/94     Pulse Rate 01/08/20 1535 67     Resp --      Temp 01/08/20 1535 98.9 F (37.2 C)     Temp Source 01/08/20 1535 Oral     SpO2 01/08/20 1535 98 %     Weight 01/08/20 1536 268 lb (121.6 kg)     Height  01/08/20 1536 5\' 10"  (1.778 m)     Head Circumference --      Peak Flow --      Pain Score 01/08/20 1535 5     Pain Loc --      Pain Edu? --      Excl. in GC? --    No data found.  Updated Vital Signs BP (!) 133/94 (BP Location: Right Arm)   Pulse 67   Temp 98.9 F (37.2 C) (Oral)   Ht 5\' 10"  (1.778 m)   Wt 121.6 kg   LMP 01/08/2020 (Approximate)   SpO2 98%   BMI 38.45 kg/m   Visual Acuity Right Eye Distance:   Left Eye Distance:   Bilateral Distance:    Right Eye Near:   Left Eye Near:    Bilateral Near:     Physical Exam Vitals and nursing note reviewed.  Constitutional:      General: She is not in acute distress.    Appearance: She is obese.    HENT:     Head: Normocephalic.     Nose: Nose normal.  Eyes:     Pupils: Pupils are equal, round, and reactive to light.  Cardiovascular:     Rate and Rhythm: Normal rate and regular rhythm.     Heart sounds: Normal heart sounds.  Pulmonary:     Effort: Pulmonary effort is normal.     Breath sounds: Normal breath sounds.  Chest:     Chest wall: Tenderness present.       Comments: There is distinct tenderness to palpation over the upper sternum extending to her right upper chest as noted on diagram.  Palpation there recreates her pain. Abdominal:     Palpations: Abdomen is soft.     Tenderness: There is no abdominal tenderness.  Musculoskeletal:        General: No tenderness.     Cervical back: Neck supple.     Right lower leg: No edema.     Left lower leg: No edema.  Lymphadenopathy:     Cervical: No cervical adenopathy.  Skin:    General: Skin is warm and dry.  Neurological:     Mental Status: She is alert and oriented to person, place, and time.      UC Treatments / Results  Labs (all labs ordered are listed, but only abnormal results are displayed) Labs Reviewed - No data to display  EKG  Rate:  71 BPM PR:  156 msec QT:  402 msec QTcH:  436 msec QRSD:  92 msec QRS axis:  14 degrees Interpretation:  within normal limits; normal sinus rhythm    Radiology DG Chest 2 View  Result Date: 01/08/2020 CLINICAL DATA:  Sudden onset right upper anterior chest pain/tenderness yesterday. Nausea. EXAM: CHEST - 2 VIEW COMPARISON:  No pertinent prior exams are available for comparison FINDINGS: Heart size within normal limits. There is no appreciable airspace consolidation. No evidence of pleural effusion or pneumothorax. No acute bony abnormality identified. IMPRESSION: No evidence of acute cardiopulmonary abnormality. Electronically Signed   By: 13/04/2019 DO   On: 01/08/2020 16:37    Procedures Procedures (including critical care time)  Medications Ordered in  UC Medications - No data to display  Initial Impression / Assessment and Plan / UC Course  I have reviewed the triage vital signs and the nursing notes.  Pertinent labs & imaging results that were available during my care of the patient were reviewed by me and  considered in my medical decision making (see chart for details).    Normal EKG and chest x-ray reassuring. Followup with Dr. Rodney Langton ([facility]) if not improving about one month.  Final Clinical Impressions(s) / UC Diagnoses   Final diagnoses:  Other chest pain  Costochondritis     Discharge Instructions     Apply ice pack for 20 to 30 minutes, 3 to 4 times daily  Continue until pain decreases.  May take Ibuprofen 200mg , 4 tabs every 8 hours with food.  May take Tylenol as needed for pain. May take Zofran as needed for nausea.    ED Prescriptions    None        , MD 01/11/20 2252

## 2020-01-08 NOTE — Discharge Instructions (Addendum)
Apply ice pack for 20 to 30 minutes, 3 to 4 times daily  Continue until pain decreases.  May take Ibuprofen 200mg , 4 tabs every 8 hours with food.  May take Tylenol as needed for pain. May take Zofran as needed for nausea.

## 2022-05-31 IMAGING — DX DG CHEST 2V
2 series · 2 of 2 positions shown · non-contrast
Comparison: No pertinent prior exams are available for comparison

CLINICAL DATA: Sudden onset right upper anterior chest
pain/tenderness yesterday. Nausea.

EXAM:
CHEST - 2 VIEW

[chest pa]
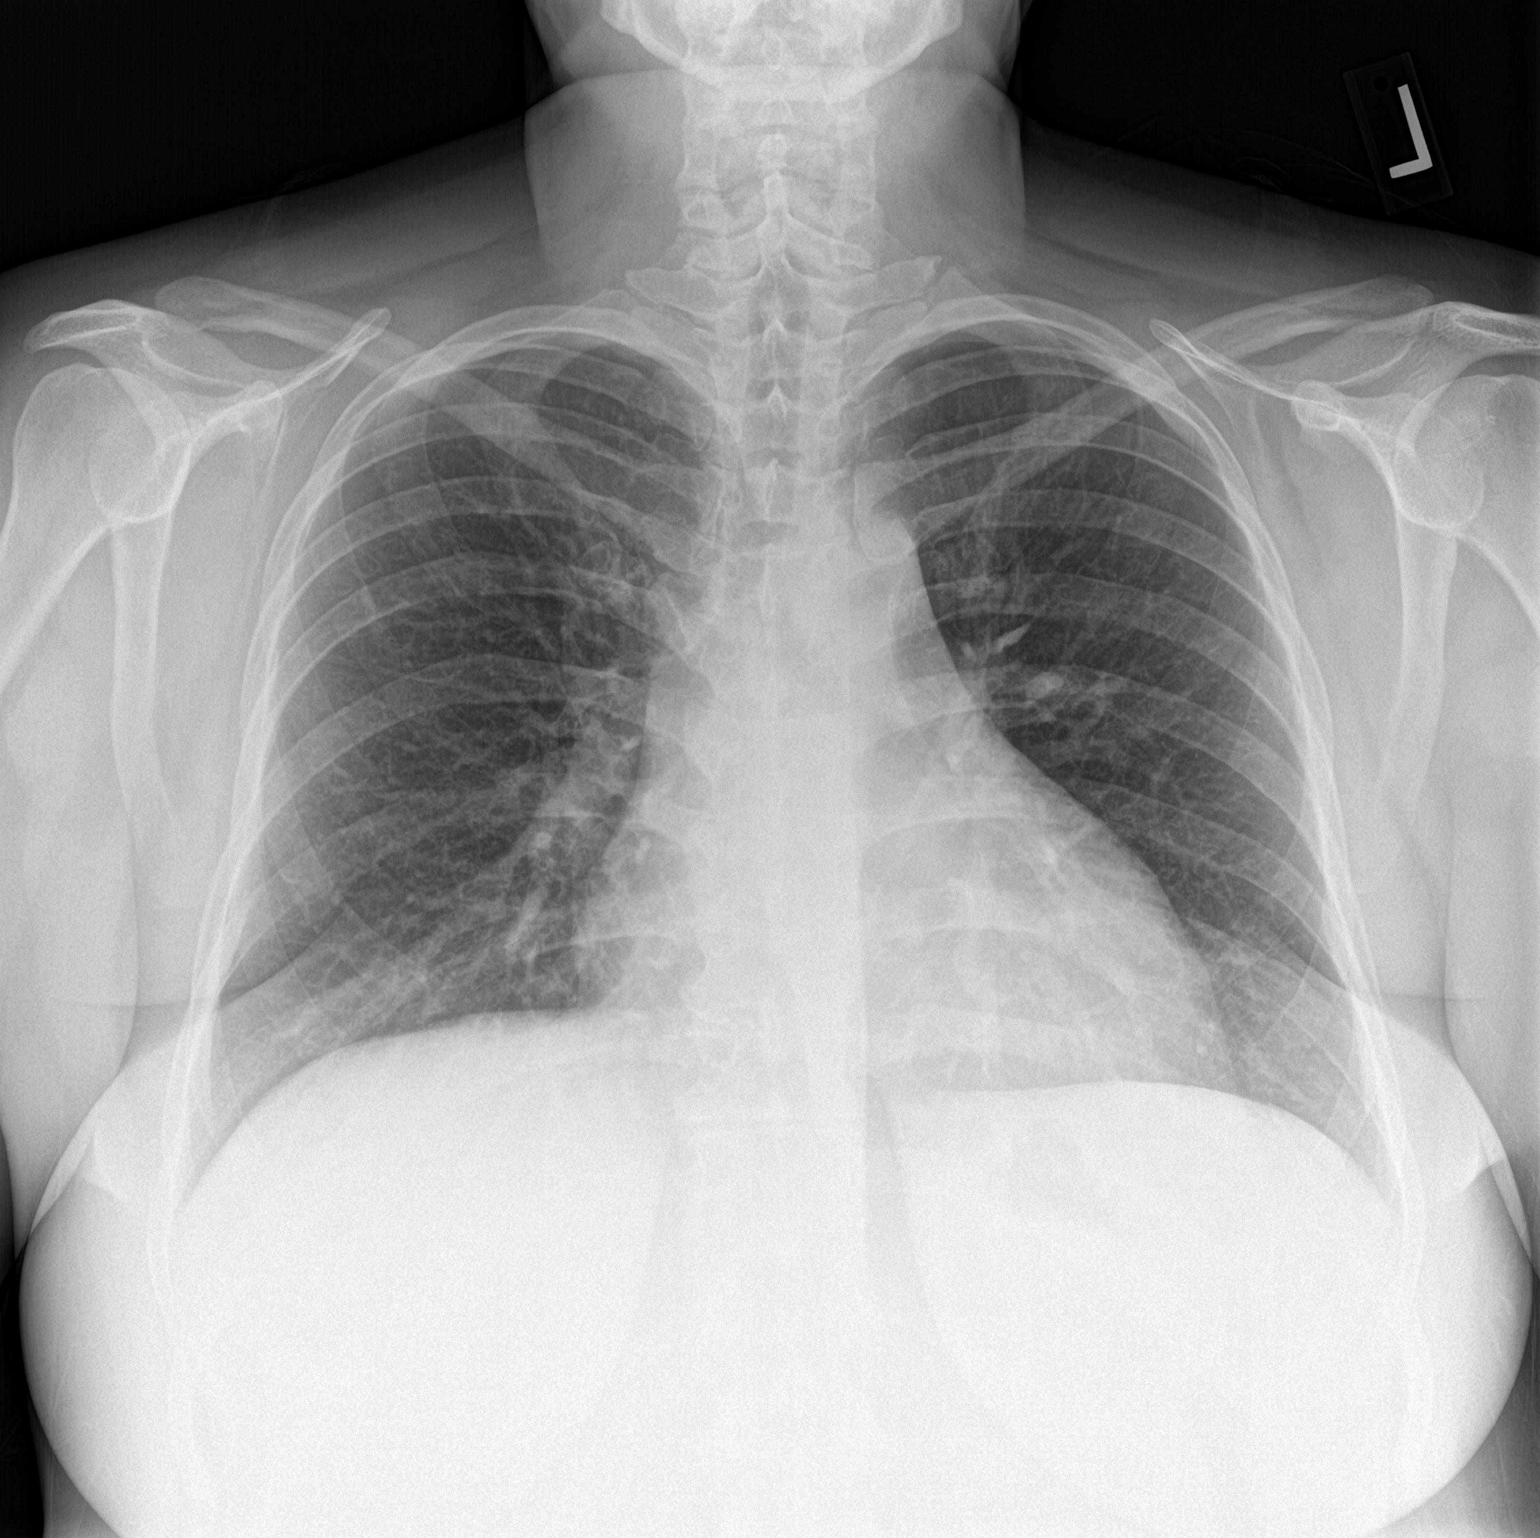

[chest lat]
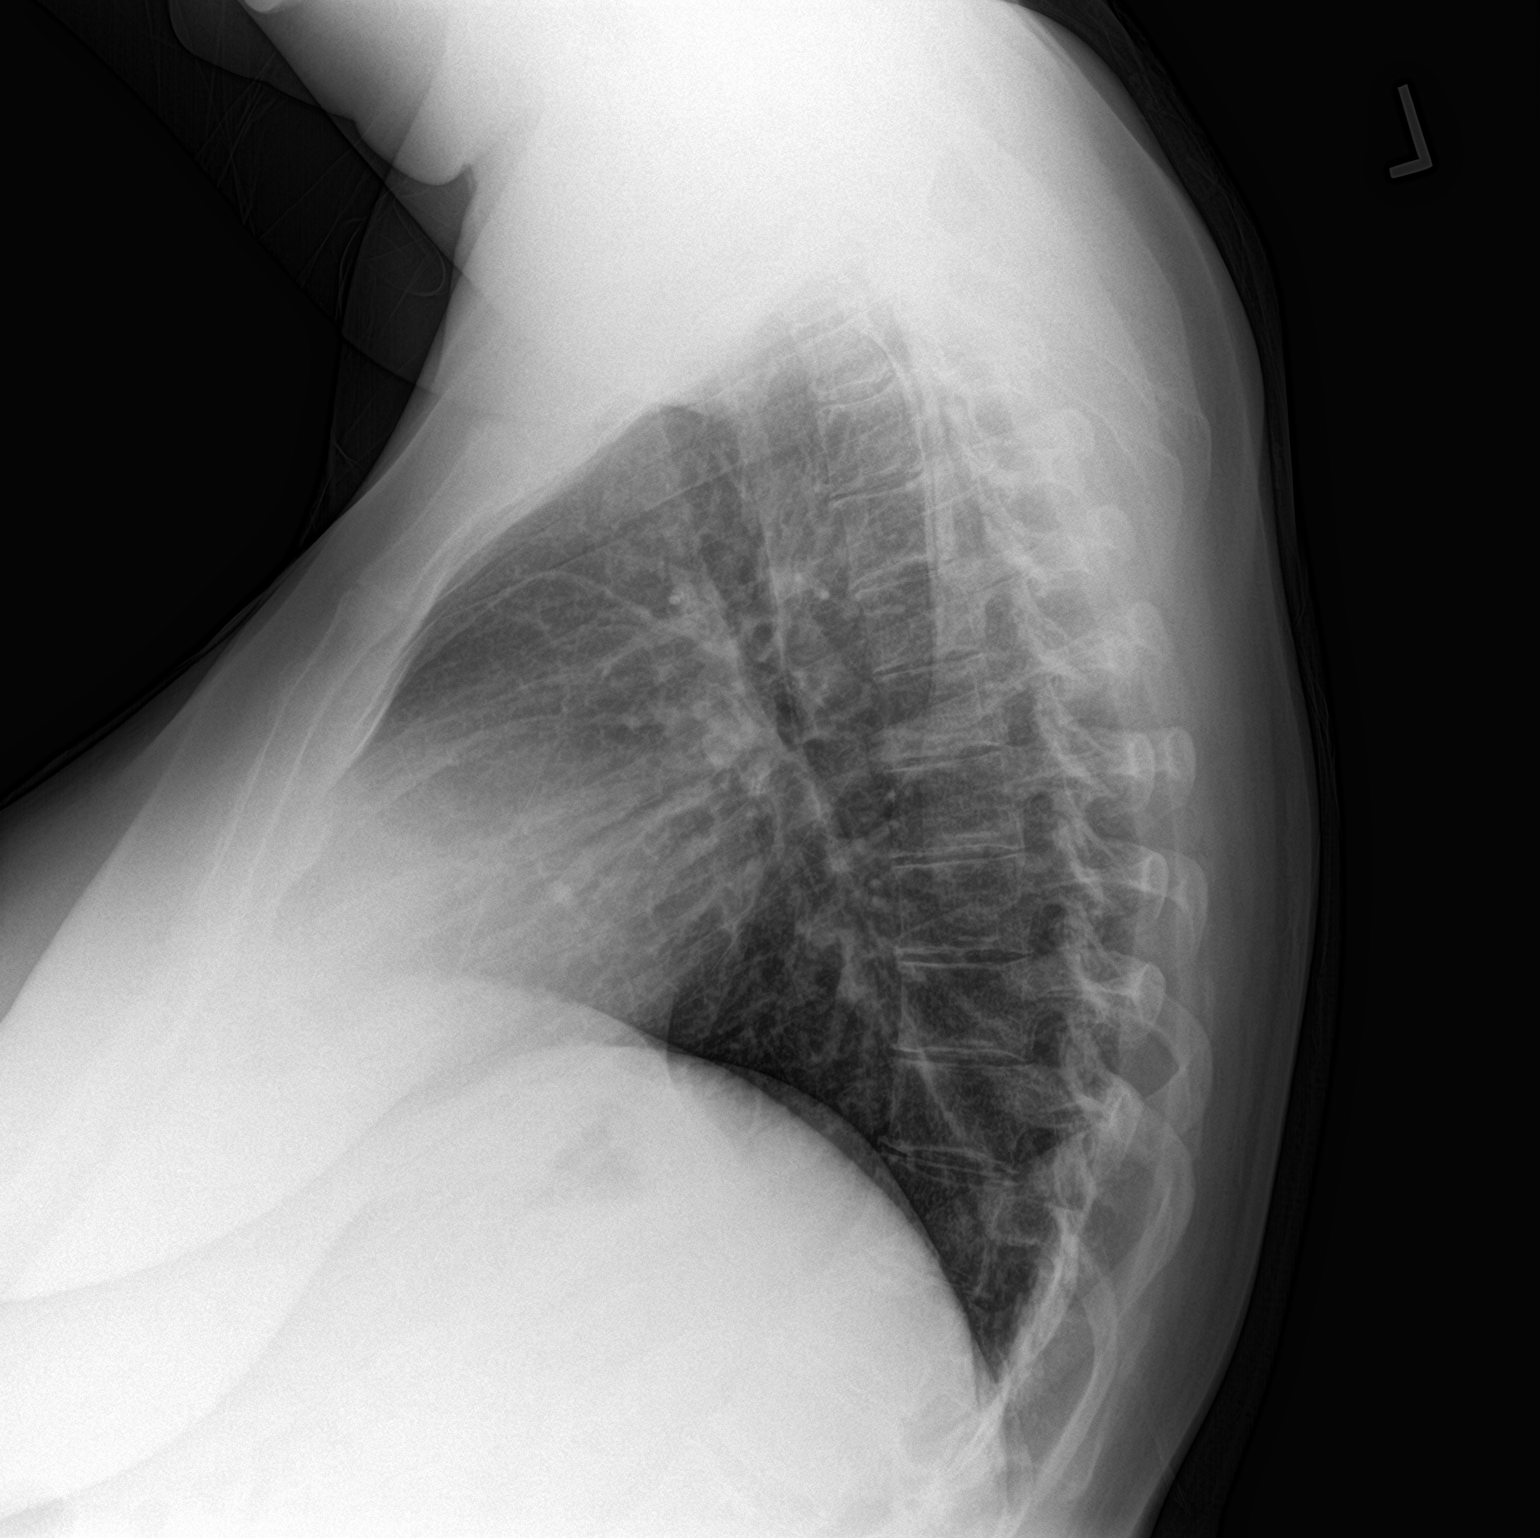

[2 of 2 positions shown; findings below may reference images not displayed]

FINDINGS: Heart size within normal limits.

There is no appreciable airspace consolidation.

No evidence of pleural effusion or pneumothorax.

No acute bony abnormality identified.
IMPRESSION: No evidence of acute cardiopulmonary abnormality.
